# Patient Record
Sex: Male | Born: 1975 | Hispanic: Yes | Marital: Married | Smoking: Never smoker
Health system: Southern US, Community
[De-identification: ages and names within clinical notes are randomized; demographics above are authoritative.]

## PROBLEM LIST (undated history)

## (undated) DIAGNOSIS — I10 Essential (primary) hypertension: Secondary | ICD-10-CM

## (undated) HISTORY — DX: Essential (primary) hypertension: I10

---

## 1999-03-12 ENCOUNTER — Encounter: Payer: Self-pay | Admitting: Emergency Medicine

## 1999-03-12 ENCOUNTER — Emergency Department (HOSPITAL_COMMUNITY): Admission: EM | Admit: 1999-03-12 | Discharge: 1999-03-12 | Payer: Self-pay

## 2013-09-16 ENCOUNTER — Ambulatory Visit: Payer: No Typology Code available for payment source | Attending: Internal Medicine

## 2013-10-03 ENCOUNTER — Ambulatory Visit: Payer: Self-pay | Admitting: Internal Medicine

## 2013-10-10 ENCOUNTER — Ambulatory Visit: Payer: No Typology Code available for payment source | Attending: Internal Medicine | Admitting: Internal Medicine

## 2013-10-10 ENCOUNTER — Encounter: Payer: Self-pay | Admitting: Internal Medicine

## 2013-10-10 ENCOUNTER — Ambulatory Visit (HOSPITAL_COMMUNITY)
Admission: RE | Admit: 2013-10-10 | Discharge: 2013-10-10 | Disposition: A | Discharge status: Home / Self Care | Payer: No Typology Code available for payment source | Source: Ambulatory Visit | Attending: Internal Medicine | Admitting: Internal Medicine

## 2013-10-10 VITALS — BP 146/100 | HR 96 | Temp 98.6°F | Resp 14 | Ht 66.0 in | Wt 205.2 lb

## 2013-10-10 DIAGNOSIS — Z008 Encounter for other general examination: Secondary | ICD-10-CM | POA: Insufficient documentation

## 2013-10-10 DIAGNOSIS — I10 Essential (primary) hypertension: Secondary | ICD-10-CM | POA: Insufficient documentation

## 2013-10-10 DIAGNOSIS — R079 Chest pain, unspecified: Secondary | ICD-10-CM | POA: Insufficient documentation

## 2013-10-10 DIAGNOSIS — Z833 Family history of diabetes mellitus: Secondary | ICD-10-CM | POA: Insufficient documentation

## 2013-10-10 DIAGNOSIS — Z23 Encounter for immunization: Secondary | ICD-10-CM | POA: Insufficient documentation

## 2013-10-10 LAB — CBC WITH DIFFERENTIAL/PLATELET
BASOS ABS: 0 10*3/uL (ref 0.0–0.1)
BASOS PCT: 0 % (ref 0–1)
Eosinophils Absolute: 0 10*3/uL (ref 0.0–0.7)
Eosinophils Relative: 1 % (ref 0–5)
HCT: 47.6 % (ref 39.0–52.0)
Hemoglobin: 17.1 g/dL — ABNORMAL HIGH (ref 13.0–17.0)
Lymphocytes Relative: 40 % (ref 12–46)
Lymphs Abs: 1.7 10*3/uL (ref 0.7–4.0)
MCH: 30.6 pg (ref 26.0–34.0)
MCHC: 35.9 g/dL (ref 30.0–36.0)
MCV: 85.2 fL (ref 78.0–100.0)
Monocytes Absolute: 0.3 10*3/uL (ref 0.1–1.0)
Monocytes Relative: 8 % (ref 3–12)
NEUTROS PCT: 51 % (ref 43–77)
Neutro Abs: 2.1 10*3/uL (ref 1.7–7.7)
PLATELETS: 186 10*3/uL (ref 150–400)
RBC: 5.59 MIL/uL (ref 4.22–5.81)
RDW: 13.6 % (ref 11.5–15.5)
WBC: 4.2 10*3/uL (ref 4.0–10.5)

## 2013-10-10 LAB — COMPLETE METABOLIC PANEL WITH GFR
ALBUMIN: 4.4 g/dL (ref 3.5–5.2)
ALT: 37 U/L (ref 0–53)
AST: 20 U/L (ref 0–37)
Alkaline Phosphatase: 49 U/L (ref 39–117)
BUN: 9 mg/dL (ref 6–23)
CALCIUM: 9.1 mg/dL (ref 8.4–10.5)
CHLORIDE: 101 meq/L (ref 96–112)
CO2: 30 meq/L (ref 19–32)
CREATININE: 0.89 mg/dL (ref 0.50–1.35)
GFR, Est African American: >89 mL/min
GFR, Est Non African American: >89 mL/min
Glucose, Bld: 99 mg/dL (ref 70–99)
Potassium: 4.2 mEq/L (ref 3.5–5.3)
Sodium: 138 mEq/L (ref 135–145)
Total Bilirubin: 0.7 mg/dL (ref 0.2–1.2)
Total Protein: 7.3 g/dL (ref 6.0–8.3)

## 2013-10-10 LAB — LIPID PANEL
Cholesterol: 178 mg/dL (ref 0–200)
HDL: 31 mg/dL — ABNORMAL LOW (ref >39)
Total CHOL/HDL Ratio: 5.7 Ratio
Triglycerides: 420 mg/dL — ABNORMAL HIGH (ref <150)

## 2013-10-10 LAB — TROPONIN I: Troponin I: <0.01 ng/mL (ref <0.06)

## 2013-10-10 LAB — TSH: TSH: 3.067 u[IU]/mL (ref 0.350–4.500)

## 2013-10-10 LAB — D-DIMER, QUANTITATIVE (NOT AT ARMC): D DIMER QUANT: 0.27 ug{FEU}/mL (ref 0.00–0.48)

## 2013-10-10 MED ORDER — LISINOPRIL 10 MG PO TABS: 5.0000 mg | ORAL_TABLET | Freq: Every day | ORAL | Status: DC

## 2013-10-10 NOTE — Progress Notes (Signed)
Patient ID: Edwin Hernandez, male   DOB: 08-17-1976, 38 y.o.   MRN: [id number]   CC:  HPI: 38 year old male with a history of hypertension, here to establish care. The patient states that he's had intermittent chest pain on and off since December. The patient had continuous chest pain for one week that resolved, mostly noted during exertion, pain radiated November stated his chest. Occasional intermittent shortness of breath. Exacerbated by anxiety. Not reproducible by palpation of the chest. No intercurrent illness such as flu or URI like symptoms. He had PCP who started him on lisinopril. The patient did take his lisinopril this morning. Blood pressures in the 140 systolic. No history of previous cardiac testing.  Patient has had early childhood immunizations. He has never received a flu shot. social history Nonsmoker nonalcoholic Family history positive for diabetes in the mother Not on File Past Medical History  Diagnosis Date  . Hypertension    No current outpatient prescriptions on file prior to visit.   No current facility-administered medications on file prior to visit.   Family History  Problem Relation Age of Onset  . Diabetes Mother    History   Social History  . Marital Status: Married    Spouse Name: N/A    Number of Children: N/A  . Years of Education: N/A   Occupational History  . Not on file.   Social History Main Topics  . Smoking status: Never Smoker   . Smokeless tobacco: Not on file  . Alcohol Use: No  . Drug Use: No  . Sexual Activity: Not on file   Other Topics Concern  . Not on file   Social History Narrative  . No narrative on file    Review of Systems  Constitutional: Negative for fever, chills, diaphoresis, activity change, appetite change and fatigue.  HENT: Negative for ear pain, nosebleeds, congestion, facial swelling, rhinorrhea, neck pain, neck stiffness and ear discharge.   Eyes: Negative for pain, discharge, redness, itching and visual  disturbance.  Respiratory: Negative for cough, choking, chest tightness, shortness of breath, wheezing and stridor.   Cardiovascular: As in history of present illness  Gastrointestinal: Negative for abdominal distention.  Genitourinary: Negative for dysuria, urgency, frequency, hematuria, flank pain, decreased urine volume, difficulty urinating and dyspareunia.  Musculoskeletal: Negative for back pain, joint swelling, arthralgias and gait problem.  Neurological: Negative for dizziness, tremors, seizures, syncope, facial asymmetry, speech difficulty, weakness, light-headedness, numbness and headaches.  Hematological: Negative for adenopathy. Does not bruise/bleed easily.  Psychiatric/Behavioral: Negative for hallucinations, behavioral problems, confusion, dysphoric mood, decreased concentration and agitation.    Objective:   Filed Vitals:   10/10/13 0914  BP: 146/100  Pulse: 96  Temp: 98.6 F (37 C)  Resp: 14    Physical Exam  Constitutional: Appears well-developed and well-nourished. No distress.  HENT: Normocephalic. External right and left ear normal. Oropharynx is clear and moist.  Eyes: Conjunctivae and EOM are normal. PERRLA, no scleral icterus.  Neck: Normal ROM. Neck supple. No JVD. No tracheal deviation. No thyromegaly.  CVS: RRR, S1/S2 +, no murmurs, no gallops, no carotid bruit.  Pulmonary: Effort and breath sounds normal, no stridor, rhonchi, wheezes, rales.  Abdominal: Soft. BS +,  no distension, tenderness, rebound or guarding.  Musculoskeletal: Normal range of motion. No edema and no tenderness.  Lymphadenopathy: No lymphadenopathy noted, cervical, inguinal. Neuro: Alert. Normal reflexes, muscle tone coordination. No cranial nerve deficit. Skin: Skin is warm and dry. No rash noted. Not diaphoretic. No erythema. No pallor.  Psychiatric: Normal mood and affect. Behavior, judgment, thought content normal.   No results found for this basename: WBC, HGB, HCT, MCV, PLT    No results found for this basename: CREATININE, BUN, NA, K, CL, CO2    No results found for this basename: HGBA1C   Lipid Panel  No results found for this basename: chol, trig, hdl, cholhdl, vldl, ldlcalc       Assessment and plan:   There are no active problems to display for this patient.  Hypertension Increase lisinopril to 10 mg a day Baseline labs, lipid panel, renal panel   Chest pain Obtain an EKG, 2-D echo Stat d-dimer, troponin If these are abnormal the patient will be scheduled for a stress test    Establish care The patient will be provided with a flu vaccination and tetanus immunization today we'll also obtain baseline labs         The patient was given clear instructions to go to ER or return to medical center if symptoms don't improve, worsen or new problems develop. The patient verbalized understanding. The patient was told to call to get any lab results if not heard anything in the next week.

## 2013-10-10 NOTE — Progress Notes (Signed)
Pt os here to establish care. Complains of chest pain and acid reflex x3 weeks. Has slight nausea/vomiting, fatigue, racing pulse, and has been hypotensive during chest pain. Also complains of headaches x2 weeks.

## 2013-10-11 ENCOUNTER — Telehealth: Payer: Self-pay | Admitting: *Deleted

## 2013-10-11 LAB — VITAMIN D 25 HYDROXY (VIT D DEFICIENCY, FRACTURES): VIT D 25 HYDROXY: 29 ng/mL — AB (ref 30–89)

## 2013-10-11 MED ORDER — GEMFIBROZIL 600 MG PO TABS: 600.0000 mg | ORAL_TABLET | Freq: Two times a day (BID) | ORAL | Status: DC

## 2013-10-11 NOTE — Telephone Encounter (Signed)
Message copied by Jameeka Marcy, [place] on Tue Oct 11, 2013  2:15 PM ------      Message from: Susie CassetteABROL MD, [facility]      Created: Mon Oct 10, 2013  2:11 PM       Chest x-ray negative ------

## 2013-10-11 NOTE — Telephone Encounter (Signed)
Message copied by Sheba Whaling, [place] on Tue Oct 11, 2013  5:33 PM ------      Message from: Susie CassetteABROL MD, [facility]      Created: Tue Oct 11, 2013  4:28 PM       Notify patient of the labs are normal with the exception of vitamin D which is 729, the patient can start taking vitamin D 2000 international units over-the-counter twice a day            Triglycerides of 420. His call in a prescription for Lopid 600 mg twice a day, ------

## 2013-10-11 NOTE — Telephone Encounter (Signed)
Tried contacting patient. Telephone line was busy. [facility]

## 2013-10-11 NOTE — Telephone Encounter (Signed)
Contacted patient with an interpreter. Unable to reach patient and also unable to leave a voicemail due to mailbox not being set up yet.

## 2013-10-12 ENCOUNTER — Other Ambulatory Visit: Payer: Self-pay | Admitting: *Deleted

## 2013-10-12 MED ORDER — GEMFIBROZIL 600 MG PO TABS: 600.0000 mg | ORAL_TABLET | Freq: Two times a day (BID) | ORAL | Status: DC

## 2013-10-18 ENCOUNTER — Ambulatory Visit (HOSPITAL_COMMUNITY): Payer: No Typology Code available for payment source

## 2013-11-01 ENCOUNTER — Ambulatory Visit: Payer: No Typology Code available for payment source | Attending: Internal Medicine | Admitting: *Deleted

## 2013-11-01 VITALS — BP 126/85 | HR 92

## 2013-11-01 DIAGNOSIS — I10 Essential (primary) hypertension: Secondary | ICD-10-CM

## 2013-11-01 MED ORDER — GEMFIBROZIL 600 MG PO TABS: 600.0000 mg | ORAL_TABLET | Freq: Two times a day (BID) | ORAL | Status: DC

## 2013-11-02 ENCOUNTER — Ambulatory Visit (HOSPITAL_COMMUNITY)
Admission: RE | Admit: 2013-11-02 | Discharge: 2013-11-02 | Disposition: A | Discharge status: Home / Self Care | Payer: No Typology Code available for payment source | Source: Ambulatory Visit | Attending: Internal Medicine | Admitting: Internal Medicine

## 2013-11-02 DIAGNOSIS — I519 Heart disease, unspecified: Secondary | ICD-10-CM

## 2013-11-02 DIAGNOSIS — R079 Chest pain, unspecified: Secondary | ICD-10-CM

## 2013-11-02 DIAGNOSIS — I10 Essential (primary) hypertension: Secondary | ICD-10-CM

## 2013-11-02 NOTE — Progress Notes (Signed)
  Echocardiogram 2D Echocardiogram has been performed.  Edwin Hernandez, [name] 11/02/2013, 10:35 AM

## 2013-11-04 ENCOUNTER — Telehealth: Payer: Self-pay | Admitting: Internal Medicine

## 2013-11-04 NOTE — Telephone Encounter (Signed)
Pt has come in today to check the status of his lab results; please give pt a call @ [phone]

## 2013-11-07 ENCOUNTER — Telehealth: Payer: Self-pay | Admitting: Emergency Medicine

## 2013-11-07 NOTE — Telephone Encounter (Signed)
Left message for pt to call clinic

## 2013-12-08 ENCOUNTER — Ambulatory Visit: Payer: No Typology Code available for payment source

## 2014-02-14 ENCOUNTER — Ambulatory Visit: Payer: No Typology Code available for payment source | Admitting: Internal Medicine

## 2014-05-12 ENCOUNTER — Ambulatory Visit: Payer: Self-pay | Attending: Family Medicine

## 2014-06-26 ENCOUNTER — Ambulatory Visit: Payer: Self-pay | Attending: Internal Medicine | Admitting: Internal Medicine

## 2014-06-26 ENCOUNTER — Encounter: Payer: Self-pay | Admitting: Internal Medicine

## 2014-06-26 VITALS — BP 144/87 | HR 79 | Temp 98.0°F | Resp 16 | Wt 205.0 lb

## 2014-06-26 DIAGNOSIS — R1012 Left upper quadrant pain: Secondary | ICD-10-CM | POA: Insufficient documentation

## 2014-06-26 DIAGNOSIS — Z139 Encounter for screening, unspecified: Secondary | ICD-10-CM

## 2014-06-26 DIAGNOSIS — R1013 Epigastric pain: Secondary | ICD-10-CM | POA: Insufficient documentation

## 2014-06-26 DIAGNOSIS — I10 Essential (primary) hypertension: Secondary | ICD-10-CM | POA: Insufficient documentation

## 2014-06-26 DIAGNOSIS — Z833 Family history of diabetes mellitus: Secondary | ICD-10-CM | POA: Insufficient documentation

## 2014-06-26 LAB — CBC WITH DIFFERENTIAL/PLATELET
Basophils Absolute: 0 10*3/uL (ref 0.0–0.1)
Basophils Relative: 0 % (ref 0–1)
Eosinophils Absolute: 0.1 10*3/uL (ref 0.0–0.7)
Eosinophils Relative: 1 % (ref 0–5)
HCT: 46.3 % (ref 39.0–52.0)
Hemoglobin: 16.2 g/dL (ref 13.0–17.0)
Lymphocytes Relative: 43 % (ref 12–46)
Lymphs Abs: 2.2 10*3/uL (ref 0.7–4.0)
MCH: 30.1 pg (ref 26.0–34.0)
MCHC: 35 g/dL (ref 30.0–36.0)
MCV: 86.1 fL (ref 78.0–100.0)
Monocytes Absolute: 0.5 10*3/uL (ref 0.1–1.0)
Monocytes Relative: 9 % (ref 3–12)
Neutro Abs: 2.4 10*3/uL (ref 1.7–7.7)
Neutrophils Relative %: 47 % (ref 43–77)
Platelets: 189 10*3/uL (ref 150–400)
RBC: 5.38 MIL/uL (ref 4.22–5.81)
RDW: 13.9 % (ref 11.5–15.5)
WBC: 5.2 10*3/uL (ref 4.0–10.5)

## 2014-06-26 MED ORDER — OMEPRAZOLE 20 MG PO CPDR: 20.0000 mg | DELAYED_RELEASE_CAPSULE | Freq: Every day | ORAL | Status: DC

## 2014-06-26 NOTE — Progress Notes (Signed)
Patient here with interpreter Patient is here to establish care Currently takes no medications Complains of lower left side abd pain that has been there for a couple of months

## 2014-06-26 NOTE — Progress Notes (Signed)
Patient Demographics  Edwin SpenceJose Hernandez, is a 38 y.o. male  WUJ:[name]  [id number]  DOB - 12/27/1973  CC:  Chief Complaint  Patient presents with  . Establish Care       HPI: Edwin SpenceJose Hernandez is a 38 y.o. male here today to establish medical care. Patient has history of hypertension  , as per patient he used to take her blood pressure medication 3 years ago then he stopped and dry to modify the diet today's blood pressure is 144/87, denies any headache dizziness chest and shortness of breath does complain of upper abdominal pain which is on the left side and bloating sensation after eating occasionally change in his bowel habits , denies nausea vomiting  Or any urinary symptoms. Patient has No headache, No chest pain, No abdominal pain - No Nausea, No new weakness tingling or numbness, No Cough - SOB.  No Known Allergies History reviewed. No pertinent past medical history. No current outpatient prescriptions on file prior to visit.   No current facility-administered medications on file prior to visit.   Family History  Problem Relation Age of Onset  . Diabetes Mother   . Stroke Maternal Uncle    History   Social History  . Marital Status: Married    Spouse Name: N/A    Number of Children: N/A  . Years of Education: N/A   Occupational History  . Not on file.   Social History Main Topics  . Smoking status: Never Smoker   . Smokeless tobacco: Not on file  . Alcohol Use: No  . Drug Use: Not on file  . Sexual Activity: Not on file   Other Topics Concern  . Not on file   Social History Narrative  . No narrative on file    Review of Systems: Constitutional: Negative for fever, chills, diaphoresis, activity change, appetite change and fatigue. HENT: Negative for ear pain, nosebleeds, congestion, facial swelling, rhinorrhea, neck pain, neck stiffness and ear discharge.  Eyes: Negative for pain, discharge, redness, itching and visual disturbance. Respiratory: Negative  for cough, choking, chest tightness, shortness of breath, wheezing and stridor.  Cardiovascular: Negative for chest pain, palpitations and leg swelling. Gastrointestinal: Negative for abdominal distention. Genitourinary: Negative for dysuria, urgency, frequency, hematuria, flank pain, decreased urine volume, difficulty urinating and dyspareunia.  Musculoskeletal: Negative for back pain, joint swelling, arthralgia and gait problem. Neurological: Negative for dizziness, tremors, seizures, syncope, facial asymmetry, speech difficulty, weakness, light-headedness, numbness and headaches.  Hematological: Negative for adenopathy. Does not bruise/bleed easily. Psychiatric/Behavioral: Negative for hallucinations, behavioral problems, confusion, dysphoric mood, decreased concentration and agitation.    Objective:   Filed Vitals:   06/26/14 1408  BP: 144/87  Pulse: 79  Temp: 98 F (36.7 C)  Resp: 16    Physical Exam: Constitutional: Patient appears well-developed and well-nourished. No distress. HENT: Normocephalic, atraumatic, External right and left ear normal. Oropharynx is clear and moist.  Eyes: Conjunctivae and EOM are normal. PERRLA, no scleral icterus. Neck: Normal ROM. Neck supple. No JVD. No tracheal deviation. No thyromegaly. CVS: RRR, S1/S2 +, no murmurs, no gallops, no carotid bruit.  Pulmonary: Effort and breath sounds normal, no stridor, rhonchi, wheezes, rales.  Abdominal: Soft. BS +, no distension, tenderness, rebound or guarding.  Musculoskeletal: Normal range of motion. No edema and no tenderness.  Neuro: Alert. Normal reflexes, muscle tone coordination. No cranial nerve deficit. Skin: Skin is warm and dry. No rash noted. Not diaphoretic. No erythema. No pallor. Psychiatric: Normal mood and affect.  Behavior, judgment, thought content normal.  No results found for this basename: WBC, HGB, HCT, MCV, PLT   No results found for this basename: CREATININE, BUN, NA, K, CL, CO2     No results found for this basename: HGBA1C   Lipid Panel  No results found for this basename: chol, trig, hdl, cholhdl, vldl, ldlcalc       Assessment and plan:   1. Essential hypertension currently patient is trying to modify diet her, and out for DASH diet. If on the following visit blood pressure is not improving consider starting her medication.  - COMPLETE METABOLIC PANEL WITH GFR  2. Family history of diabetes mellitus (DM)  - Hemoglobin A1c  3. Screening ordered baseline blood work  - CBC with Differential - Vit D  25 hydroxy (rtn osteoporosis monitoring) - TSH - Lipid panel  4. Dyspepsia  - US Abdomen Complete; Future - trial of  omeprazole (PRILOSEC) 20 MG capsule; Take 1 capsule (20 mg total) by mouth daily.  Dispense: 30 capsule; Refill: 3  5. Abdominal pain, left upper quadrant  - US Abdomen Complete; Future      Health Maintenance Patient declines flu shot  Return in about 3 months (around 09/26/2014) for hypertension.   Doris CheadleADVANI, Dalina Samara, MD

## 2014-06-26 NOTE — Patient Instructions (Addendum)
Plan de alimentacin DASH (DASH Eating Plan) DASH es la sigla en ingls de "Enfoques Alimentarios para Detener la Hipertensin". El plan de alimentacin DASH ha demostrado bajar la presin arterial elevada (hipertensin). Los beneficios adicionales para la salud pueden incluir la disminucin del riesgo de diabetes mellitus tipo2, enfermedades cardacas e ictus. Este plan tambin puede ayudar a adelgazar. QU DEBO SABER ACERCA DEL PLAN DE ALIMENTACIN DASH? Para el plan de alimentacin DASH, seguir las siguientes pautas generales:  Elija los alimentos con un valor porcentual diario de sodio de menos del 5% (segn figura en la etiqueta del alimento).  Use hierbas o aderezos sin sal, en lugar de sal de mesa o sal marina.  Consulte al mdico o farmacutico antes de usar sustitutos de la sal.  Coma productos con bajo contenido de sodio, cuya etiqueta suele decir "bajo contenido de sodio" o "sin agregado de sal".  Coma alimentos frescos.  Coma ms verduras, frutas y productos lcteos con bajo contenido de grasas.  Elija los cereales integrales. Busque la palabra "integral" en el primer lugar de la lista de ingredientes.  Elija el pescado y el pollo o el pavo sin piel ms a menudo que las carnes rojas. Limite el consumo de pescado, carne de ave y carne a 6onzas (170g) por da.  Limite el consumo de dulces, postres, azcares y bebidas azucaradas.  Elija las grasas saludables para el corazn.  Limite el consumo de queso a 1onza (28g) por da.  Consuma ms comida casera y menos de restaurante, de buf y comida rpida.  Limite el consumo de alimentos fritos.  Cocine los alimentos utilizando mtodos que no sean la fritura.  Limite las verduras enlatadas. Si las consume, enjuguelas bien para disminuir el sodio.  Cuando coma en un restaurante, pida que preparen su comida con menos sal o, en lo posible, sin nada de sal. QU ALIMENTOS PUEDO COMER? Pida ayuda a un nutricionista para  conocer las necesidades calricas individuales. Cereales Pan de salvado o integral. Arroz integral. Pastas de salvado o integrales. Quinua, trigo burgol y cereales integrales. Cereales con bajo contenido de sodio. Tortillas de harina de maz o de salvado. Pan de maz integral. Galletas saladas integrales. Galletas con bajo contenido de sodio. Vegetales Verduras frescas o congeladas (crudas, al vapor, asadas o grilladas). Jugos de tomate y verduras con contenido bajo o reducido de sodio. Pasta y salsa de tomate con contenido bajo o reducido de sodio. Verduras enlatadas con bajo contenido de sodio o reducido de sodio.  Frutas Frutas frescas, en conserva (en su jugo natural) o frutas congeladas. Carnes y otros productos con protenas Carne de res molida (al 85% o ms magra), carne de res de animales alimentados con pastos o carne de res sin la grasa. Pollo o pavo sin piel. Carne de pollo o de pavo molida. Cerdo sin la grasa. Todos los pescados y frutos de mar. Huevos. Porotos, guisantes o lentejas secos. Frutos secos y semillas sin sal. Frijoles enlatados sin sal. Lcteos Productos lcteos con bajo contenido de grasas, como leche descremada o al 1%, quesos reducidos en grasas o al 2%, ricota con bajo contenido de grasas o queso cottage, o yogur natural con bajo contenido de grasas. Quesos con contenido bajo o reducido de sodio. Grasas y aceites Margarinas en barra que no contengan grasas trans. Mayonesa y alios para ensaladas livianos o reducidos en grasas (reducidos en sodio). [place]. Aceites de crtamo, oliva o canola. Mantequilla natural de man o almendra. Otros Palomitas de maz y pretzels sin sal.   Los artculos mencionados arriba pueden no ser una lista completa de las bebidas o los alimentos recomendados. Comunquese con el nutricionista para conocer ms opciones. QU ALIMENTOS NO SE RECOMIENDAN? Cereales Pan blanco. Pastas blancas. Arroz blanco. Pan de maz refinado. Bagels y  croissants. Galletas saladas que contengan grasas trans. Vegetales Vegetales con crema o fritos. Verduras en salsa de queso. Verduras enlatadas comunes. Pasta y salsa de tomate en lata comunes. Jugos comunes de tomate y de verduras. Frutas Frutas secas. Fruta enlatada en almbar liviano o espeso. Jugo de frutas. Carnes y otros productos con protenas Cortes de carne con grasa. Costillas, alas de pollo, tocineta, salchicha, mortadela, salame, chinchulines, tocino, perros calientes, salchichas alemanas y embutidos envasados. Frutos secos y semillas con sal. Frijoles con sal en lata. Lcteos Leche entera o al 2%, crema, mezcla de leche y crema, y queso crema. Yogur entero o endulzado. Quesos o queso azul con alto contenido de grasas. Cremas no lcteas y coberturas batidas. Quesos procesados, quesos para untar o cuajadas. Condimentos Sal de cebolla y ajo, sal condimentada, sal de mesa y sal marina. Salsas en lata y envasadas. Salsa Worcestershire. Salsa trtara. Salsa barbacoa. Salsa teriyaki. Salsa de soja, incluso la que tiene contenido reducido de sodio. Salsa de carne. Salsa de pescado. Salsa de ostras. Salsa rosada. Rbano picante. Ketchup y mostaza. Saborizantes y tiernizantes para carne. Caldo en cubitos. Salsa picante. Salsa tabasco. Adobos. Aderezos para tacos. Salsas. Grasas y aceites Mantequilla, margarina en barra, manteca de cerdo, grasa, mantequilla clarificada y grasa de tocino. Aceites de coco, de palmiste o de palma. Aderezos comunes para ensalada. Otros Pickles y aceitunas. Palomitas de maz y pretzels con sal. Los artculos mencionados arriba pueden no ser una lista completa de las bebidas y los alimentos que se deben evitar. Comunquese con el nutricionista para obtener ms informacin. DNDE PUEDO ENCONTRAR MS INFORMACIN? [employer], [employer]mn y [employer] la [facility]re (National Heart, Lung, and Blood Institute):  www.nhlbi.nih.gov/health/health-topics/topics/dash/ Document Released: 08/07/2011 Document Revised: 01/02/2014 Exit[employer] Patient Information 2015 [employer]. This information is not intended to replace advice given to you by your health care provider. Make sure you discuss any questions you have with your health care provider.   

## 2014-06-27 ENCOUNTER — Telehealth: Payer: Self-pay | Admitting: *Deleted

## 2014-06-27 LAB — COMPLETE METABOLIC PANEL WITH GFR
ALT: 35 U/L (ref 0–53)
AST: 19 U/L (ref 0–37)
Albumin: 4.6 g/dL (ref 3.5–5.2)
Alkaline Phosphatase: 55 U/L (ref 39–117)
BUN: 11 mg/dL (ref 6–23)
CO2: 26 mEq/L (ref 19–32)
Calcium: 9.5 mg/dL (ref 8.4–10.5)
Chloride: 100 mEq/L (ref 96–112)
Creat: 0.85 mg/dL (ref 0.50–1.35)
GFR, Est African American: >89 mL/min
GFR, Est Non African American: >89 mL/min
Glucose, Bld: 96 mg/dL (ref 70–99)
Potassium: 4.5 mEq/L (ref 3.5–5.3)
Sodium: 140 mEq/L (ref 135–145)
Total Bilirubin: 0.5 mg/dL (ref 0.2–1.2)
Total Protein: 7.5 g/dL (ref 6.0–8.3)

## 2014-06-27 LAB — LIPID PANEL
Cholesterol: 190 mg/dL (ref 0–200)
HDL: 34 mg/dL — ABNORMAL LOW (ref >39)
LDL Cholesterol: 81 mg/dL (ref 0–99)
Total CHOL/HDL Ratio: 5.6 Ratio
Triglycerides: 376 mg/dL — ABNORMAL HIGH (ref <150)
VLDL: 75 mg/dL — ABNORMAL HIGH (ref 0–40)

## 2014-06-27 LAB — VITAMIN D 25 HYDROXY (VIT D DEFICIENCY, FRACTURES): Vit D, 25-Hydroxy: 30 ng/mL (ref 30–89)

## 2014-06-27 LAB — HEMOGLOBIN A1C
Hgb A1c MFr Bld: 5.9 % — ABNORMAL HIGH (ref <5.7)
Mean Plasma Glucose: 123 mg/dL — ABNORMAL HIGH (ref <117)

## 2014-06-27 LAB — TSH: TSH: 2.856 u[IU]/mL (ref 0.350–4.500)

## 2014-06-27 NOTE — Telephone Encounter (Deleted)
Message copied by Dyann KiefGIRALDEZ, Darcell Yacoub M on Tue Jun 27, 2014  4:07 PM ------      Message from: Doris CheadleADVANI, DEEPAK      Created: Tue Jun 27, 2014  1:04 PM       Blood work reviewed noticed hemoglobin A1c of 5.9%, patient has prediabetes, also noticed elevated triglycerides call and advise patient for low carbohydrate and low-fat diet.             ------

## 2014-06-27 NOTE — Telephone Encounter (Signed)
Message copied by Dyann KiefGIRALDEZ, Avyanna Spada M on Tue Jun 27, 2014  4:15 PM ------      Message from: Doris CheadleADVANI, DEEPAK      Created: Tue Jun 27, 2014  1:04 PM       Blood work reviewed noticed hemoglobin A1c of 5.9%, patient has prediabetes, also noticed elevated triglycerides call and advise patient for low carbohydrate and low-fat diet.             ------

## 2014-06-27 NOTE — Telephone Encounter (Signed)
Pt aware of lab results 

## 2014-06-30 ENCOUNTER — Ambulatory Visit (HOSPITAL_COMMUNITY)
Admission: RE | Admit: 2014-06-30 | Discharge: 2014-06-30 | Disposition: A | Discharge status: Home / Self Care | Payer: Self-pay | Source: Ambulatory Visit | Attending: Internal Medicine | Admitting: Internal Medicine

## 2014-06-30 DIAGNOSIS — R1012 Left upper quadrant pain: Secondary | ICD-10-CM

## 2014-06-30 DIAGNOSIS — K760 Fatty (change of) liver, not elsewhere classified: Secondary | ICD-10-CM | POA: Insufficient documentation

## 2014-06-30 DIAGNOSIS — R1013 Epigastric pain: Secondary | ICD-10-CM

## 2014-07-03 ENCOUNTER — Telehealth: Payer: Self-pay | Admitting: *Deleted

## 2014-07-03 NOTE — Telephone Encounter (Signed)
Pt aware of US results. 

## 2014-07-03 NOTE — Telephone Encounter (Signed)
-----   Message from Doris Cheadleeepak Advani, MD sent at 06/30/2014 12:32 PM EDT ----- Call and let the patient know that his abdominal ultrasound reported possible fatty liver, there are no gallstones.

## 2015-03-13 IMAGING — CR DG CHEST 2V
2 series · 2 of 2 positions shown · non-contrast
Comparison: None.

CLINICAL DATA: Chest pain

EXAM:
CHEST  2 VIEW

[w chest pa]
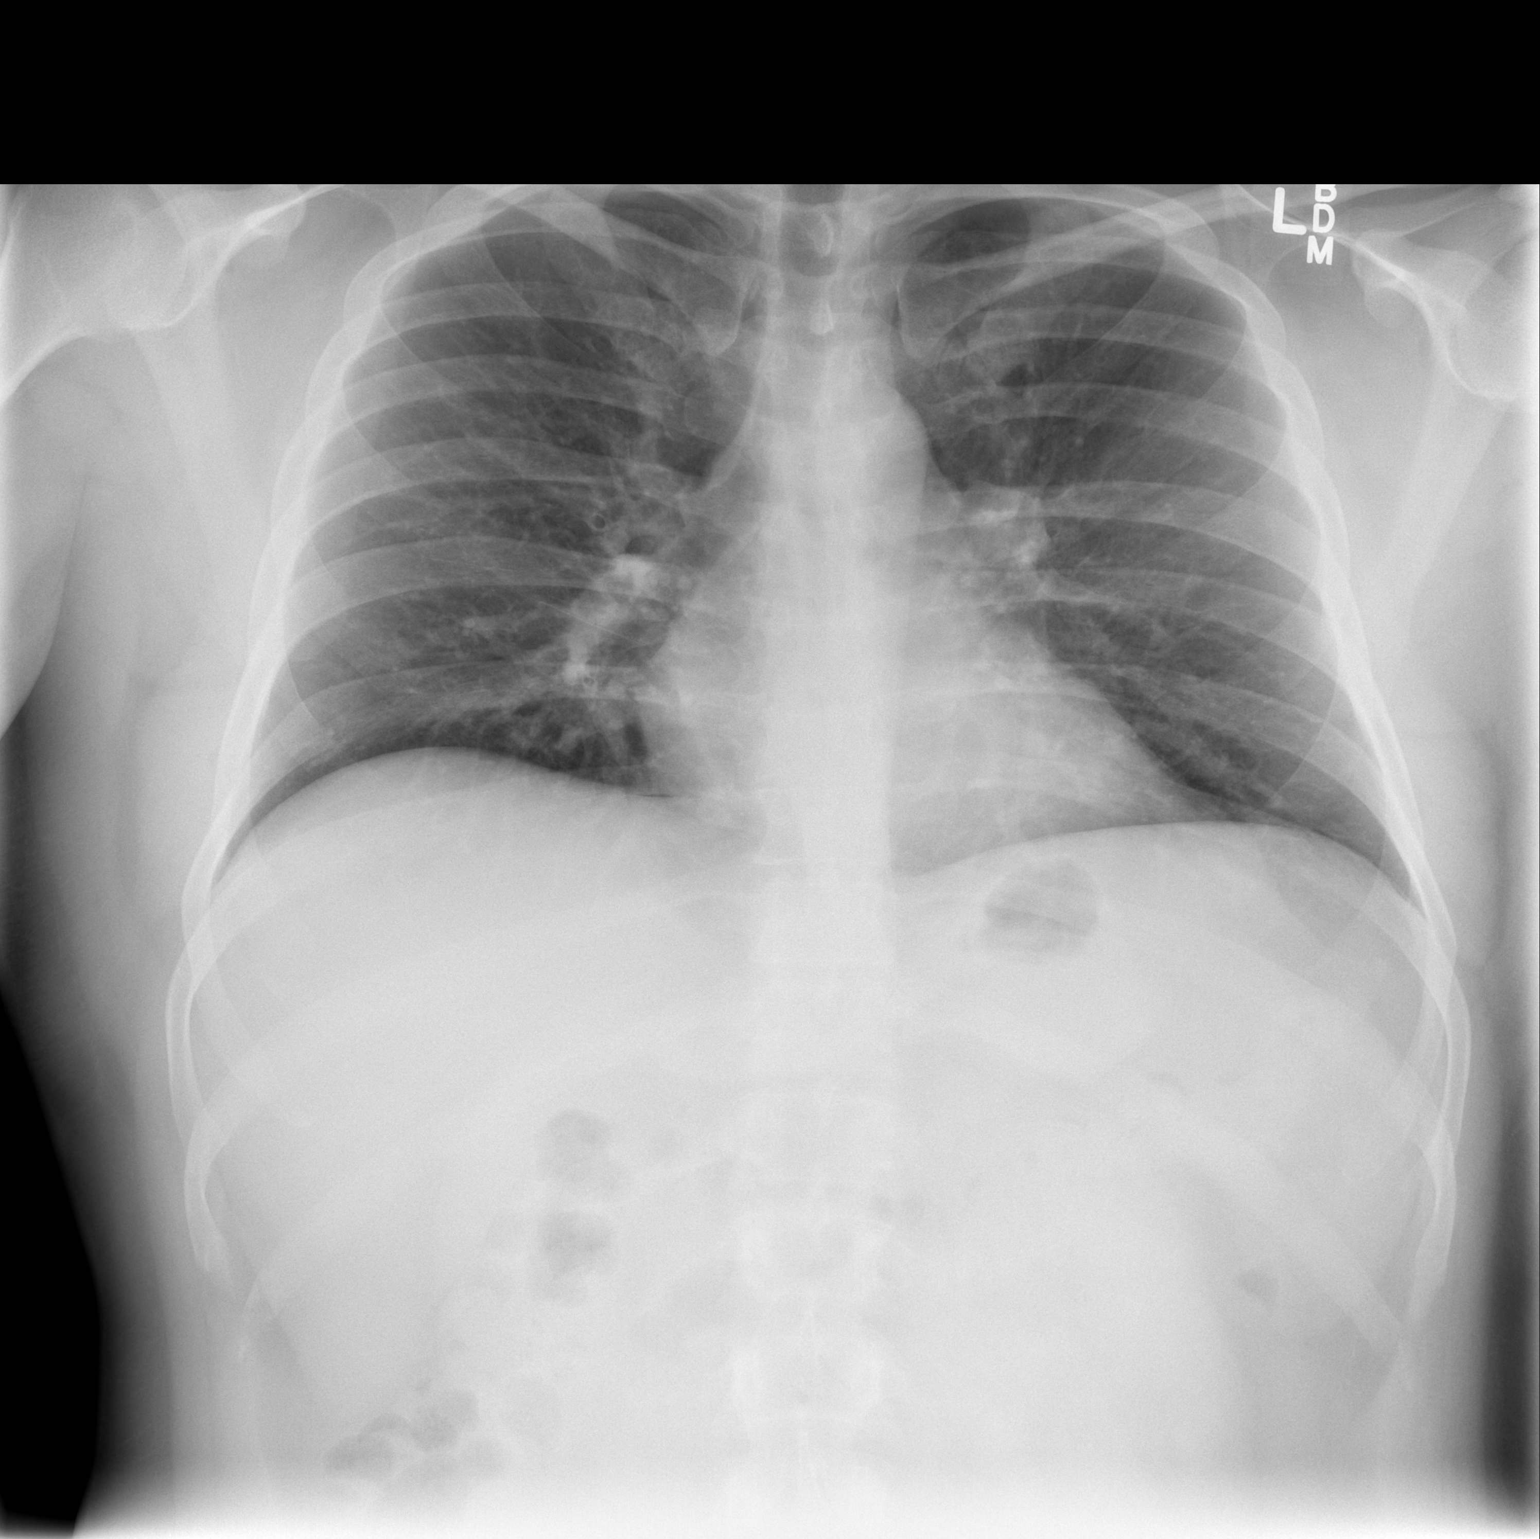

[w chest lat]
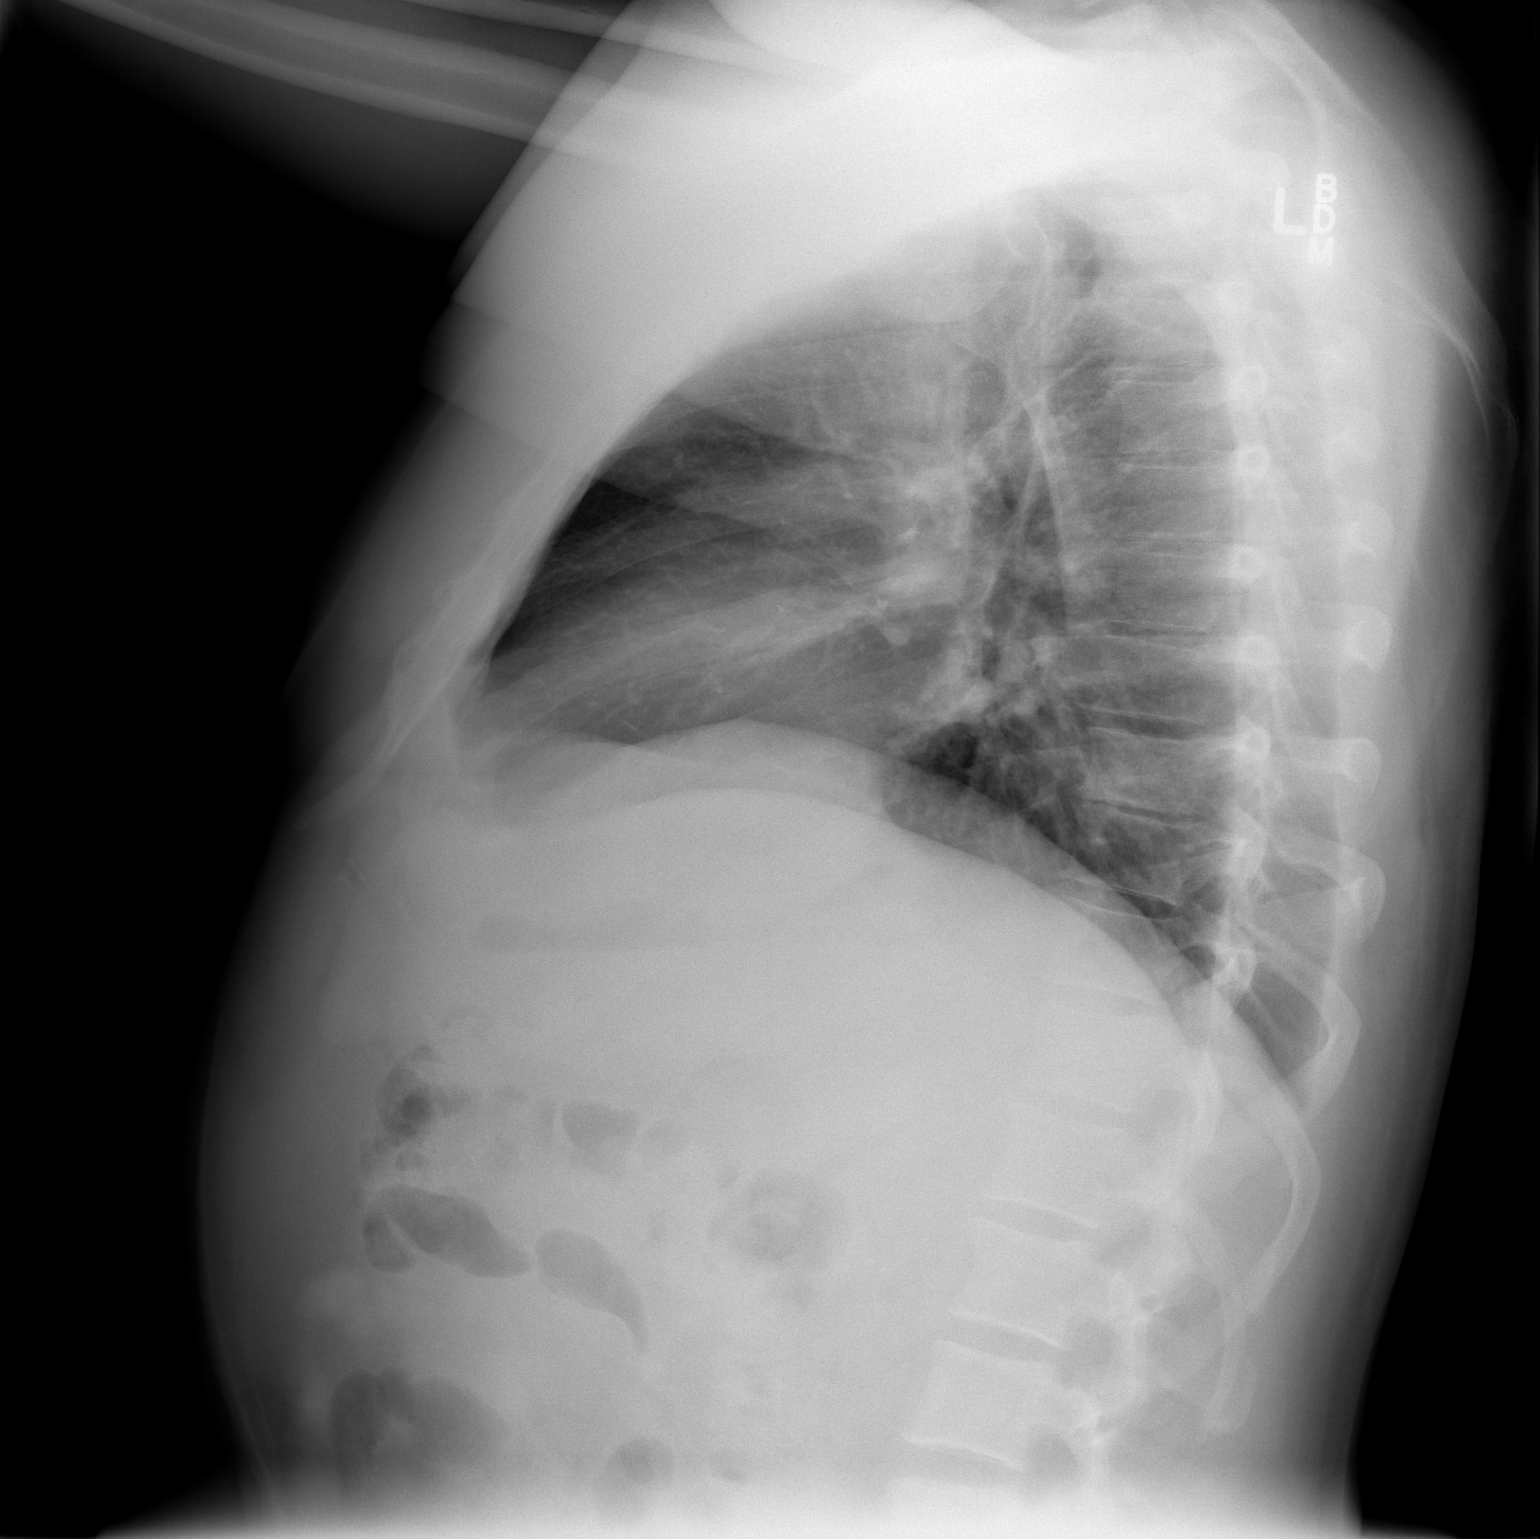

[2 of 2 positions shown; findings below may reference images not displayed]

FINDINGS: The heart size and mediastinal contours are within normal limits.
Both lungs are clear. The visualized skeletal structures are
unremarkable.
IMPRESSION: No active cardiopulmonary disease.

## 2016-02-10 IMAGING — US US ABDOMEN COMPLETE
1 series · 14 of 25 positions shown · non-contrast
Comparison: None.

CLINICAL DATA: Dyspepsia and left upper quadrant abdominal pain.

EXAM:
ULTRASOUND ABDOMEN COMPLETE

[Series 1: us abdomen complete · 0.27mm/px · 14 of 65 slices shown]
[im 1/65]
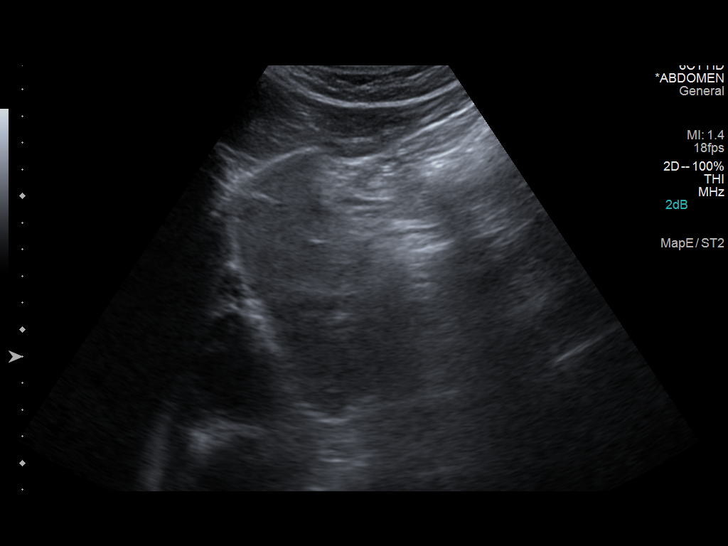
[im 6/65]
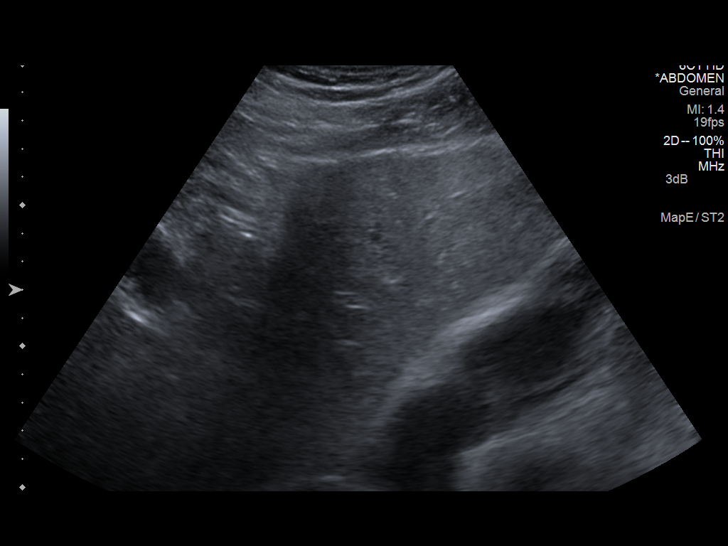
[im 11/65]
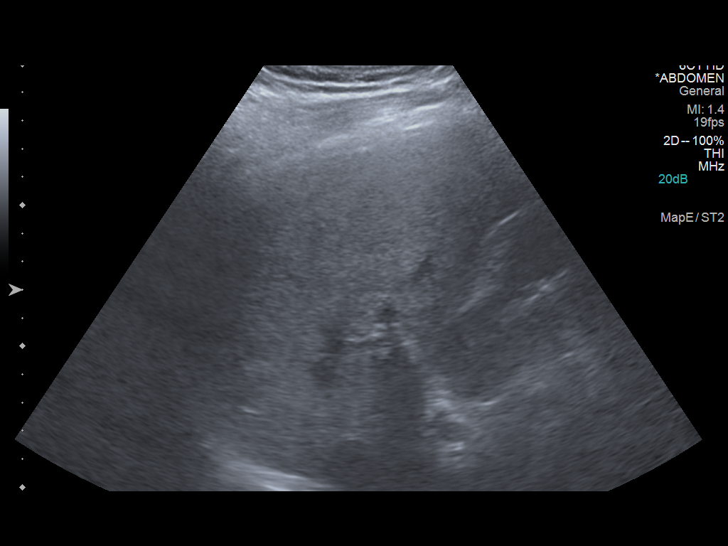
[im 17/65]
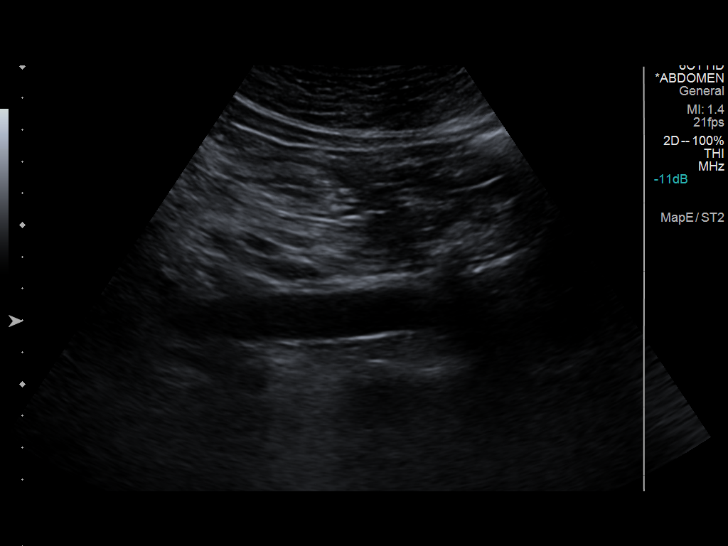
[im 22/65]
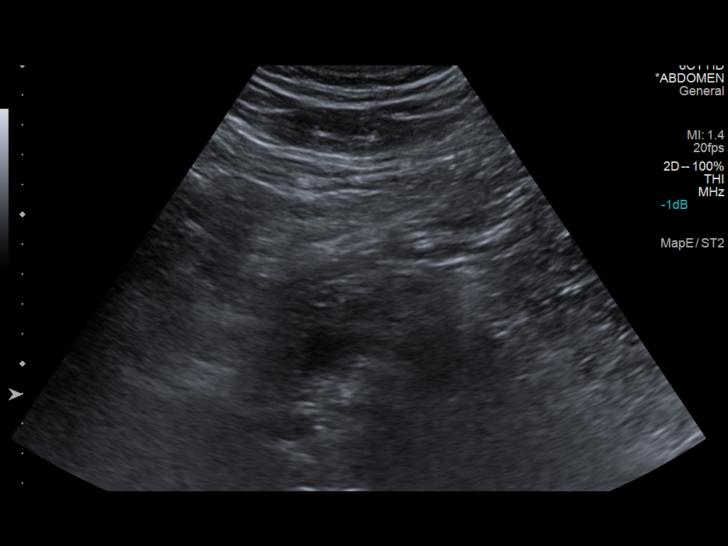
[im 25/65]
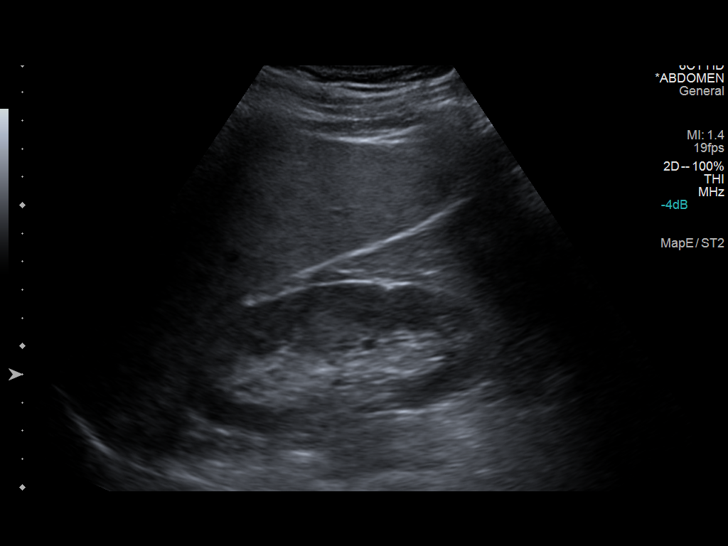
[im 30/65]
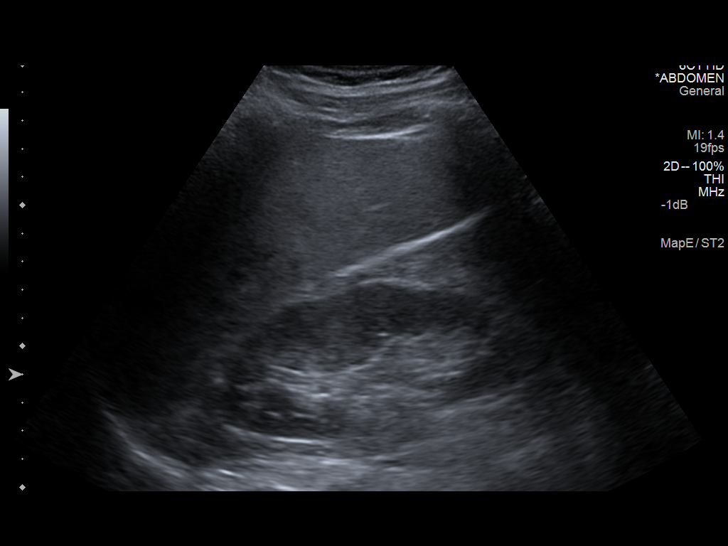
[im 35/65]
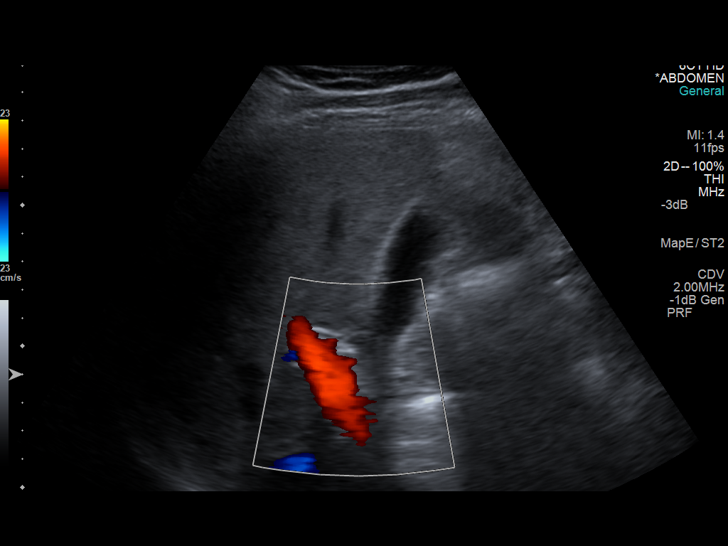
[im 41/65]
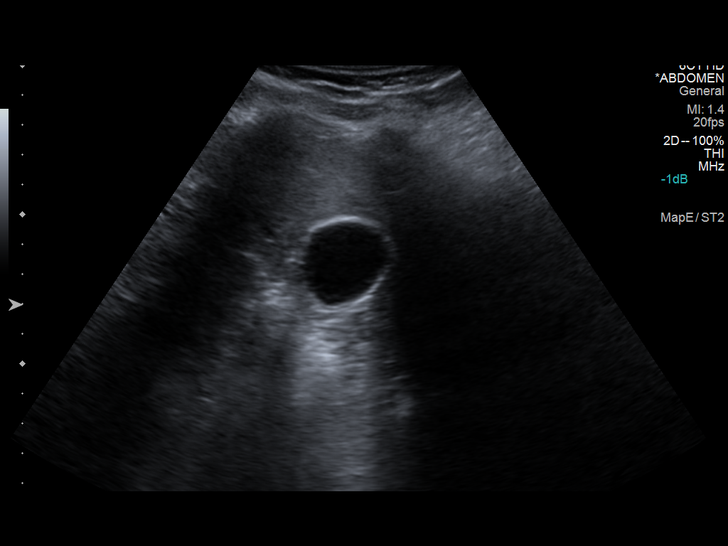
[im 43/65]
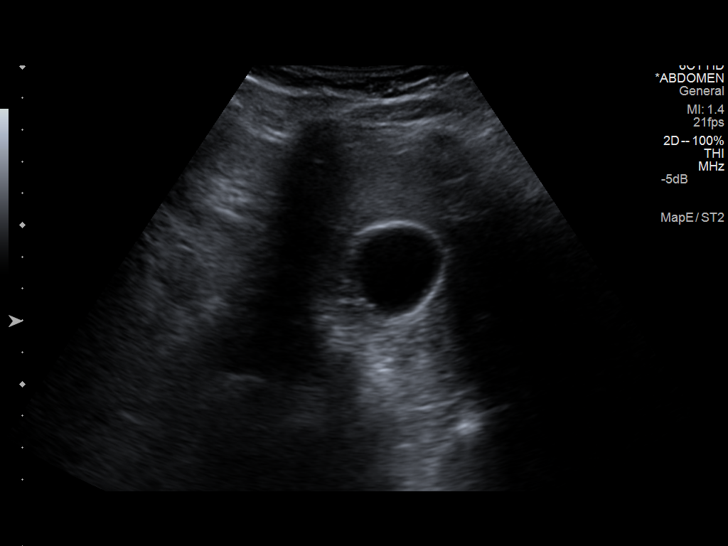
[im 49/65]
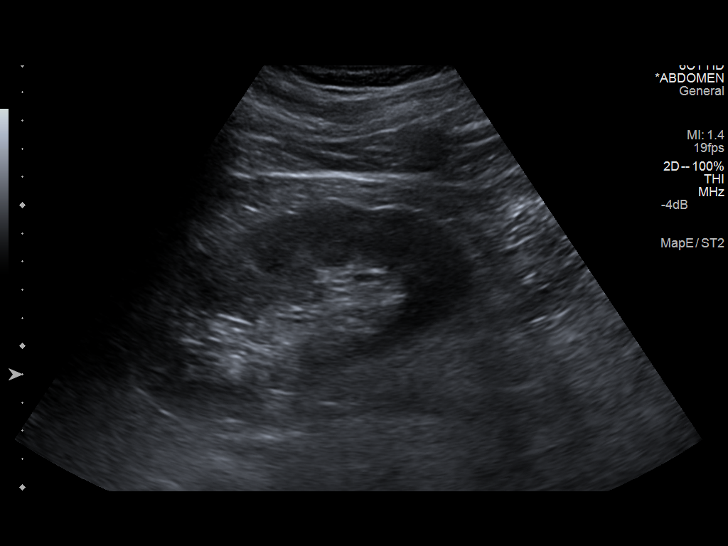
[im 54/65]
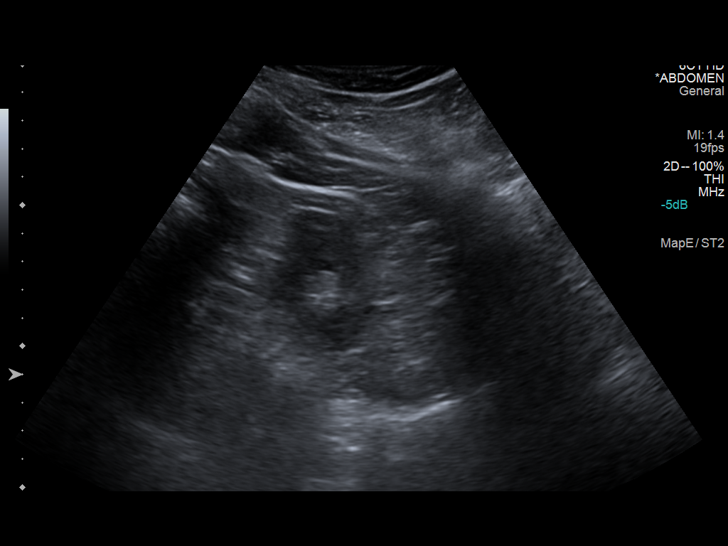
[im 59/65]
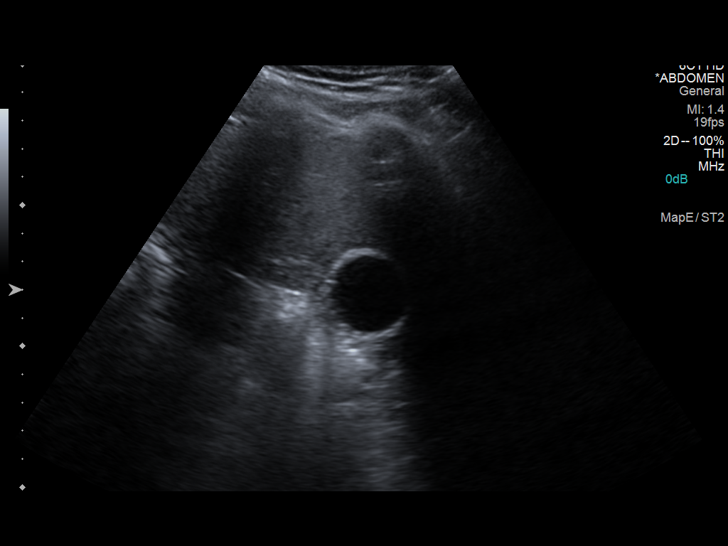
[im 65/65]
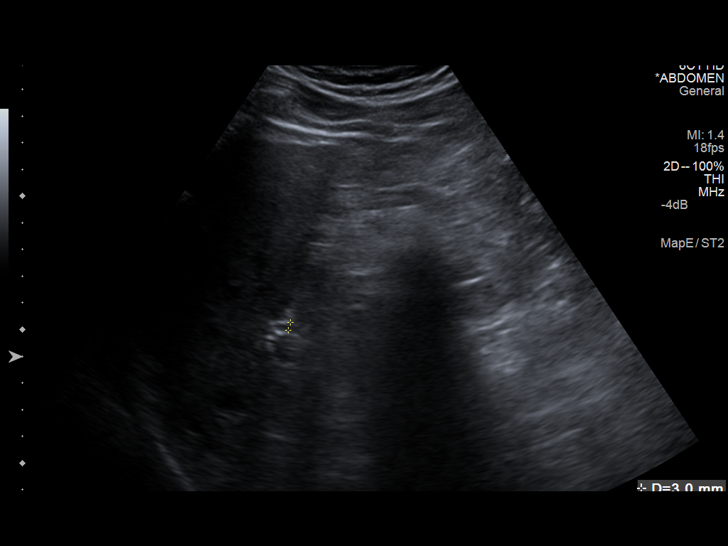

[14 of 25 positions shown; findings below may reference images not displayed]

FINDINGS: Gallbladder: No gallstones or wall thickening visualized. No
sonographic Murphy sign noted.

Common bile duct: Diameter: 3 mm

Liver: Diffusely echogenic and slightly heterogeneous without focal
abnormality identified.

IVC: No abnormality visualized.

Pancreas: Visualized portion unremarkable.

Spleen: Size and appearance within normal limits.

Right Kidney: Length: 12.5 cm. Echogenicity within normal limits. No
mass or hydronephrosis visualized.

Left Kidney: Length: 13.0 cm. Echogenicity within normal limits. No
mass or hydronephrosis visualized.

Abdominal aorta: No aneurysm visualized.

Other findings: None.
IMPRESSION: 1. Diffusely echogenic liver, nonspecific but compatible with
steatosis.
2. Unremarkable appearance of the gallbladder without evidence of
gallstones or biliary dilatation.
3. Normal appearance of the kidneys.

## 2016-04-29 ENCOUNTER — Encounter: Payer: Self-pay | Admitting: Internal Medicine

## 2020-03-16 ENCOUNTER — Other Ambulatory Visit: Payer: Self-pay

## 2020-03-16 ENCOUNTER — Ambulatory Visit
Admission: EM | Admit: 2020-03-16 | Discharge: 2020-03-16 | Disposition: A | Discharge status: Home / Self Care | Payer: Self-pay | Attending: Emergency Medicine | Admitting: Emergency Medicine

## 2020-03-16 DIAGNOSIS — T628X1A Toxic effect of other specified noxious substances eaten as food, accidental (unintentional), initial encounter: Secondary | ICD-10-CM

## 2020-03-16 DIAGNOSIS — T781XXA Other adverse food reactions, not elsewhere classified, initial encounter: Secondary | ICD-10-CM

## 2020-03-16 DIAGNOSIS — R220 Localized swelling, mass and lump, head: Secondary | ICD-10-CM

## 2020-03-16 MED ORDER — PREDNISONE 10 MG (21) PO TBPK: ORAL_TABLET | Freq: Every day | ORAL | 0 refills | Status: DC

## 2020-03-16 MED ORDER — DEXAMETHASONE SODIUM PHOSPHATE 10 MG/ML IJ SOLN
10.0000 mg | Freq: Once | INTRAMUSCULAR | Status: AC
  Administered 2020-03-16: 10 mg via INTRAMUSCULAR

## 2020-03-16 MED ORDER — FAMOTIDINE 40 MG PO TABS
40.0000 mg | ORAL_TABLET | Freq: Once | ORAL | Status: AC
  Administered 2020-03-16: 40 mg via ORAL

## 2020-03-16 MED ORDER — DIPHENHYDRAMINE HCL 50 MG/ML IJ SOLN
50.0000 mg | Freq: Once | INTRAMUSCULAR | Status: AC
  Administered 2020-03-16: 50 mg via INTRAMUSCULAR

## 2020-03-16 NOTE — ED Notes (Signed)
Pt reports decrease in symptoms, respirations even and unlabored. Pt instructed to go to ER if symptoms get worse, verbal understanding of all instructions provided.

## 2020-03-16 NOTE — ED Provider Notes (Signed)
[facility]    [name]: [id number] Arrival date & time: 03/16/20  1838      History   Chief Complaint Chief Complaint  Patient presents with  . Allergic Reaction    HPI Edwin Hernandez is a 44 y.o. male with history of hypertension presenting with his son for evaluation of facial swelling.  Patient states that he has an allergic reaction to peanuts: Ate some about 2 hours ago.  States he felt like his lips were swelling.  States he is able to take a Benadryl, though vomited shortly after.  States he has had improvement in swelling.  Patient denies history of anaphylaxis, chest pain, throat tightening, shortness of breath.  Has never needed EpiPen for this.     Past Medical History:  Diagnosis Date  . Hypertension     Patient Active Problem List   Diagnosis Date Noted  . Essential hypertension 06/26/2014  . Family history of diabetes mellitus (DM) 06/26/2014  . Dyspepsia 06/26/2014    History reviewed. No pertinent surgical history.     Home Medications    Prior to Admission medications   Medication Sig Start Date End Date Taking? Authorizing Provider  amLODipine (NORVASC) 5 MG tablet amlodipine 5 mg tablet  TAKE ONE TABLET BY MOUTH EVERY DAY    [provider]  ciprofloxacin (CIPRO) 500 MG tablet ciprofloxacin 500 mg tablet  TAKE ONE TABLET BY MOUTH EVERY TWELVE HOURS WITH A MEAL FOR 28 DAYS    [provider]  cyclobenzaprine (FLEXERIL) 5 MG tablet cyclobenzaprine 5 mg tablet  TAKE ONE TABLET BY MOUTH AT BEDTIME FOR 14 DAYS    [provider]  diclofenac (VOLTAREN) 50 MG EC tablet diclofenac sodium 50 mg tablet,delayed release  TAKE ONE TABLET BY MOUTH WITH FOOD TWICE DAILY FOR 14 DAYS    [provider]  phenazopyridine (PYRIDIUM) 200 MG tablet phenazopyridine 200 mg tablet  TAKE ONE TABLET BY MOUTH TWICE DAILY FOR 2 DAYS    [provider]  predniSONE (STERAPRED UNI-PAK 21 TAB) 10 MG (21) TBPK tablet Take by  mouth daily. Take steroid taper as written 03/16/20   Hall-Potvin, [place], PA-C  sertraline (ZOLOFT) 50 MG tablet sertraline 50 mg tablet  TAKE ONE TABLET BY MOUTH EVERY DAY    [provider]  tamsulosin (FLOMAX) 0.4 MG CAPS capsule tamsulosin 0.4 mg capsule  TAKE ONE CAPSULE BY MOUTH EVERY DAY    [provider]    Family History Family History  Problem Relation Age of Onset  . Diabetes Mother   . Stroke Maternal Uncle     Social History Social History   Tobacco Use  . Smoking status: Never Smoker  Substance Use Topics  . Alcohol use: No  . Drug use: Not on file     Allergies   Peanut-containing drug products   Review of Systems As per HPI   Physical Exam Triage Vital Signs ED Triage Vitals  Enc Vitals Group     BP      Pulse      Resp      Temp      Temp src      SpO2      Weight      Height      Head Circumference      Peak Flow      Pain Score      Pain Loc      Pain Edu?      Excl.  in GC?    No data found.  Updated Vital Signs BP 131/78   Pulse 85   Temp 98 F (36.7 C) (Oral)   Resp 18   SpO2 98%   Visual Acuity Right Eye Distance:   Left Eye Distance:   Bilateral Distance:    Right Eye Near:   Left Eye Near:    Bilateral Near:     Physical Exam Constitutional:      General: He is not in acute distress. HENT:     Head: Normocephalic and atraumatic.     Mouth/Throat:     Pharynx: Oropharynx is clear. No oropharyngeal exudate or posterior oropharyngeal erythema.     Comments: Negative angioedema.  Uvula midline, nonedematous.  No tonsillar hypertrophy.  Mild redness around mouth. Eyes:     General: No scleral icterus.    Pupils: Pupils are equal, round, and reactive to light.  Neck:     Comments: Trachea midline, negative JVD Cardiovascular:     Rate and Rhythm: Normal rate and regular rhythm.  Pulmonary:     Effort: Pulmonary effort is normal. No respiratory distress.     Breath sounds: No stridor. No  wheezing or rales.     Comments: Good air entry bilaterally without prolonged expiratory phase Skin:    Coloration: Skin is not jaundiced or pale.     Findings: Rash present.     Comments: Diffuse, erythematous.  No urticaria  Neurological:     Mental Status: He is alert and oriented to person, place, and time.      UC Treatments / Results  Labs (all labs ordered are listed, but only abnormal results are displayed) Labs Reviewed - No data to display  EKG   Radiology No results found.  Procedures Procedures (including critical care time)  Medications Ordered in UC Medications  dexamethasone (DECADRON) injection 10 mg (10 mg Intramuscular Given 03/16/20 1851)  diphenhydrAMINE (BENADRYL) injection 50 mg (50 mg Intramuscular Given 03/16/20 1900)  famotidine (PEPCID) tablet 40 mg (40 mg Oral Given 03/16/20 1916)    Initial Impression / Assessment and Plan / UC Course  I have reviewed the triage vital signs and the nursing notes.  Pertinent labs & imaging results that were available during my care of the patient were reviewed by me and considered in my medical decision making (see chart for details).     Patient afebrile, nontoxic, and hemodynamically stable in office.  Denying chest pain, shortness of breath.  Given IM Decadron and Benadryl and PO pepcid given in office which he tolerated well.  Patient observed for 30 minutes: Reporting improvement in erythema, warmth of rash, ease of breath.  Heart rate trending towards normal, oxygen 98% at time of discharge.  Will continue Benadryl at bedtime, steroid taper tomorrow morning.  ER return precautions discussed, pt verbalized understanding and is agreeable to plan.  Discharged in stable condition with wife who will transport him home.   Called and spoke with patient on phone this morning (7/17 08:50): States he was able to pick up prednisone taper, reviewed regimen.  States that he has been feeling well: No emesis, swelling of  lips, tongue, throat, chest pain, difficulty breathing.  Rash nearly resolved.  No additional questions at this time. Final Clinical Impressions(s) / UC Diagnoses   Final diagnoses:  Allergic reaction to peanut  Facial swelling     Discharge Instructions     You were given steroid shot today. Take steroids starting tomorrow as discussed. May take  Benadryl every night for the next few nights. Go to ER for worsening swelling, pain, throat tightening, chest pain, difficulty breathing, lightheadedness.    ED Prescriptions    Medication Sig Dispense Auth. Provider   predniSONE (STERAPRED UNI-PAK 21 TAB) 10 MG (21) TBPK tablet Take by mouth daily. Take steroid taper as written 21 tablet Hall-Potvin, [place], PA-C     PDMP not reviewed this encounter.   Hall-Potvin, Edwin Hernandez, [place] 03/17/20 [phone]

## 2020-03-16 NOTE — ED Triage Notes (Signed)
Pt with known allergy to peanuts ate cookie with peanuts on accident. Benadryl taken PTA, pt states SOB has significantly decreased since then. Rash to entire body.

## 2020-03-16 NOTE — Discharge Instructions (Signed)
You were given steroid shot today. Take steroids starting tomorrow as discussed. May take Benadryl every night for the next few nights. Go to ER for worsening swelling, pain, throat tightening, chest pain, difficulty breathing, lightheadedness.

## 2024-08-12 ENCOUNTER — Emergency Department (HOSPITAL_BASED_OUTPATIENT_CLINIC_OR_DEPARTMENT_OTHER)
Admission: EM | Admit: 2024-08-12 | Discharge: 2024-08-12 | Disposition: A | Discharge status: Home / Self Care | Payer: Self-pay | Attending: Emergency Medicine | Admitting: Emergency Medicine

## 2024-08-12 ENCOUNTER — Other Ambulatory Visit: Payer: Self-pay

## 2024-08-12 ENCOUNTER — Encounter (HOSPITAL_BASED_OUTPATIENT_CLINIC_OR_DEPARTMENT_OTHER): Payer: Self-pay | Admitting: Emergency Medicine

## 2024-08-12 ENCOUNTER — Emergency Department (HOSPITAL_BASED_OUTPATIENT_CLINIC_OR_DEPARTMENT_OTHER): Payer: Self-pay

## 2024-08-12 ENCOUNTER — Ambulatory Visit
Admission: EM | Admit: 2024-08-12 | Discharge: 2024-08-12 | Disposition: A | Discharge status: Home / Self Care | Payer: Self-pay | Attending: Family Medicine | Admitting: Family Medicine

## 2024-08-12 DIAGNOSIS — I10 Essential (primary) hypertension: Secondary | ICD-10-CM | POA: Insufficient documentation

## 2024-08-12 DIAGNOSIS — R5381 Other malaise: Secondary | ICD-10-CM | POA: Insufficient documentation

## 2024-08-12 DIAGNOSIS — G4700 Insomnia, unspecified: Secondary | ICD-10-CM | POA: Insufficient documentation

## 2024-08-12 DIAGNOSIS — H538 Other visual disturbances: Secondary | ICD-10-CM

## 2024-08-12 DIAGNOSIS — R531 Weakness: Secondary | ICD-10-CM

## 2024-08-12 DIAGNOSIS — Z91010 Allergy to peanuts: Secondary | ICD-10-CM | POA: Insufficient documentation

## 2024-08-12 LAB — CBC WITH DIFFERENTIAL/PLATELET
Abs Immature Granulocytes: 0.02 K/uL (ref 0.00–0.07)
Basophils Absolute: 0 K/uL (ref 0.0–0.1)
Basophils Relative: 0 %
Eosinophils Absolute: 0 K/uL (ref 0.0–0.5)
Eosinophils Relative: 0 %
HCT: 51.6 % (ref 39.0–52.0)
Hemoglobin: 17.7 g/dL — ABNORMAL HIGH (ref 13.0–17.0)
Immature Granulocytes: 0 %
Lymphocytes Relative: 27 %
Lymphs Abs: 2.3 K/uL (ref 0.7–4.0)
MCH: 29.9 pg (ref 26.0–34.0)
MCHC: 34.3 g/dL (ref 30.0–36.0)
MCV: 87.3 fL (ref 80.0–100.0)
Monocytes Absolute: 0.5 K/uL (ref 0.1–1.0)
Monocytes Relative: 6 %
Neutro Abs: 5.5 K/uL (ref 1.7–7.7)
Neutrophils Relative %: 67 %
Platelets: 205 K/uL (ref 150–400)
RBC: 5.91 MIL/uL — ABNORMAL HIGH (ref 4.22–5.81)
RDW: 13.5 % (ref 11.5–15.5)
WBC: 8.4 K/uL (ref 4.0–10.5)
nRBC: 0 % (ref 0.0–0.2)

## 2024-08-12 LAB — BASIC METABOLIC PANEL WITH GFR
Anion gap: 14 (ref 5–15)
BUN: 8 mg/dL (ref 6–20)
CO2: 25 mmol/L (ref 22–32)
Calcium: 9.4 mg/dL (ref 8.9–10.3)
Chloride: 98 mmol/L (ref 98–111)
Creatinine, Ser: 0.94 mg/dL (ref 0.61–1.24)
GFR, Estimated: >60 mL/min (ref >60)
Glucose, Bld: 92 mg/dL (ref 70–99)
Potassium: 3.9 mmol/L (ref 3.5–5.1)
Sodium: 137 mmol/L (ref 135–145)

## 2024-08-12 LAB — RESP PANEL BY RT-PCR (RSV, FLU A&B, COVID)  RVPGX2
Influenza A by PCR: NEGATIVE
Influenza B by PCR: NEGATIVE
Resp Syncytial Virus by PCR: NEGATIVE
SARS Coronavirus 2 by RT PCR: NEGATIVE

## 2024-08-12 MED ORDER — MELATONIN 3 MG PO TABS: 3.0000 mg | ORAL_TABLET | Freq: Every day | ORAL | 0 refills | Status: DC

## 2024-08-12 NOTE — ED Notes (Addendum)
 PCP appointment scheduled by this RN.   Tuesday, September 27, 2024 @ 10:20 AM. Edwin Hernandez.  Pt verbalized understanding.

## 2024-08-12 NOTE — ED Provider Notes (Signed)
 [facility]    [name]: [id number] Arrival date & time: 08/12/24  1806      History   Chief Complaint Chief Complaint  Patient presents with   Weakness    HPI Edwin Hernandez is a 48 y.o. male.   HPI pleasant 48 year old male presents with weakness, chills, shortness of breath, loss of appetite, blurred vision and itchy skin.  Patient patient reports symptoms began 1 week ago.  Patient reports tested positive for flu 3 weeks ago.  Patient is accompanied by family member who will be translating as Science Writer offered and denied by patient/family.  PMH significant for obesity, acute prostatitis HTN, and dyspepsia.  Past Medical History:  Diagnosis Date   Hypertension     Patient Active Problem List   Diagnosis Date Noted   Essential hypertension 06/26/2014   Family history of diabetes mellitus (DM) 06/26/2014   Dyspepsia 06/26/2014    History reviewed. No pertinent surgical history.     Home Medications    Prior to Admission medications  Not on File    Family History Family History  Problem Relation Age of Onset   Diabetes Mother    Stroke Maternal Uncle     Social History Social History[1]   Allergies   Peanut-containing drug products   Review of Systems Review of Systems  Eyes:  Positive for visual disturbance.  Respiratory:  Positive for shortness of breath.   Skin:  Positive for rash.  Neurological:  Positive for dizziness and weakness.  All other systems reviewed and are negative.    Physical Exam Triage Vital Signs ED Triage Vitals  Encounter Vitals Group     BP 08/12/24 1814 (!) 159/93     Girls Systolic BP Percentile --      Girls Diastolic BP Percentile --      Boys Systolic BP Percentile --      Boys Diastolic BP Percentile --      Pulse Rate 08/12/24 1814 70     Resp 08/12/24 1814 18     Temp 08/12/24 1814 98.5 F (36.9 C)     Temp Source 08/12/24 1814 Oral     SpO2 08/12/24 1814 95 %     Weight --      Height  08/12/24 1814 5' 5.5 (1.664 m)     Head Circumference --      Peak Flow --      Pain Score 08/12/24 1827 0     Pain Loc --      Pain Education --      Exclude from Growth Chart --    No data found.  Updated Vital Signs BP (!) 142/94 (BP Location: Left Arm) Comment: provider took manual BP.  Pulse 70   Temp 98.5 F (36.9 C) (Oral)   Resp 18   Ht 5' 6 (1.676 m)   SpO2 95%   Physical Exam Vitals and nursing note reviewed.  Constitutional:      General: He is not in acute distress.    Appearance: Normal appearance. He is obese. He is not ill-appearing.  HENT:     Head: Normocephalic and atraumatic.     Right Ear: Tympanic membrane, ear canal and external ear normal.     Left Ear: Tympanic membrane, ear canal and external ear normal.     Mouth/Throat:     Mouth: Mucous membranes are moist.     Pharynx: Oropharynx is clear.  Eyes:     Extraocular Movements: Extraocular  movements intact.     Conjunctiva/sclera: Conjunctivae normal.     Pupils: Pupils are equal, round, and reactive to light.     Comments: Patient reporting bilateral blurry vision on exam  Neck:     Comments: No JVD, no bruit Cardiovascular:     Rate and Rhythm: Normal rate and regular rhythm.     Pulses: Normal pulses.     Heart sounds: Normal heart sounds.  Pulmonary:     Effort: Pulmonary effort is normal.     Breath sounds: Normal breath sounds. No wheezing, rhonchi or rales.  Musculoskeletal:        General: Normal range of motion.     Cervical back: Normal range of motion and neck supple.  Skin:    General: Skin is warm and dry.  Neurological:     General: No focal deficit present.     Mental Status: He is alert and oriented to person, place, and time. Mental status is at baseline.  Psychiatric:        Mood and Affect: Mood normal.        Behavior: Behavior normal.      UC Treatments / Results  Labs (all labs ordered are listed, but only abnormal results are displayed) Labs Reviewed - No  data to display  EKG   Radiology No results found.  Procedures Procedures (including critical care time)  Medications Ordered in UC Medications - No data to display  Initial Impression / Assessment and Plan / UC Course  I have reviewed the triage vital signs and the nursing notes.  Pertinent labs & imaging results that were available during my care of the patient were reviewed by me and considered in my medical decision making (see chart for details).     MDM: 1.  Weakness, generalized-Advised patient and son go to [facility] now for further evaluation of blurry vision and weakness.  Patient/son agreed and verbalized understanding of these instructions and this plan of care this evening. 2.  Blurred vision-same as 1.  Patient/son agreed and verbalized understanding of these instructions and this plan of care this evening.  Staff has scheduled patient with [facility] primary care. Final Clinical Impressions(s) / UC Diagnoses   Final diagnoses:  Weakness generalized  Blurred vision     Discharge Instructions      Advised patient and son go to [facility] now for further evaluation of blurry vision and weakness.     ED Prescriptions   None    PDMP not reviewed this encounter.    [1]  Social History Tobacco Use   Smoking status: Never   Smokeless tobacco: Never  Vaping Use   Vaping status: Never Used  Substance Use Topics   Alcohol use: No   Drug use: Never     Teddy Sharper, FNP 08/12/24 1906

## 2024-08-12 NOTE — ED Provider Notes (Signed)
 [facility] Provider Note  [name]: [id number] Arrival date & time: 08/12/24 1930  Chief Complaint(s) Weakness  HPI Edwin Hernandez is a 48 y.o. male with past medical history as below, significant for hypertension who presents to the ED with complaint of generalized weakness, itching, insomnia  Patient here with failures, he was offered interpreter services but versus have family translate for him.  Reports he was diagnosed with the flu around 3 weeks ago, he stopped taking his testosterone around that time.  His flu like symptoms have since improved.  Around a week ago he began having increased insomnia, felt his skin was itchy, reports increased fatigue.  He has no chest pain, abdominal pain, no nausea or vomiting, no change in bowel or bladder function.  Past Medical History Past Medical History:  Diagnosis Date   Hypertension    Patient Active Problem List   Diagnosis Date Noted   Essential hypertension 06/26/2014   Family history of diabetes mellitus (DM) 06/26/2014   Dyspepsia 06/26/2014   Home Medication(s) Prior to Admission medications  Medication Sig Start Date End Date Taking? Authorizing Provider  melatonin 3 MG TABS tablet Take 1 tablet (3 mg total) by mouth at bedtime. 08/13/24 09/12/24  Cottie Donnice PARAS, MD                                                                                                                                    Past Surgical History History reviewed. No pertinent surgical history. Family History Family History  Problem Relation Age of Onset   Diabetes Mother    Stroke Maternal Uncle     Social History Social History[1] Allergies Peanut-containing drug products  Review of Systems A thorough review of systems was obtained and all systems are negative except as noted in the HPI and PMH.   Physical Exam Vital Signs  I have reviewed the triage vital signs BP (!) 141/95   Pulse 71   Temp  99.2 F (37.3 C) (Oral)   Resp 17   SpO2 98%  Physical Exam Vitals and nursing note reviewed.  Constitutional:      General: He is not in acute distress.    Appearance: He is well-developed.  HENT:     Head: Normocephalic and atraumatic.     Right Ear: External ear normal.     Left Ear: External ear normal.     Mouth/Throat:     Mouth: Mucous membranes are moist.  Eyes:     General: No scleral icterus.    Extraocular Movements: Extraocular movements intact.     Pupils: Pupils are equal, round, and reactive to light.  Cardiovascular:     Rate and Rhythm: Normal rate and regular rhythm.     Pulses: Normal pulses.     Heart sounds: Normal heart sounds.  Pulmonary:     Effort: Pulmonary effort is normal. No respiratory  distress.     Breath sounds: Normal breath sounds.  Abdominal:     General: Abdomen is flat.     Palpations: Abdomen is soft.     Tenderness: There is no abdominal tenderness.  Musculoskeletal:     Cervical back: No rigidity.     Right lower leg: No edema.     Left lower leg: No edema.  Skin:    General: Skin is warm and dry.     Capillary Refill: Capillary refill takes less than 2 seconds.  Neurological:     Mental Status: He is alert and oriented to person, place, and time.     GCS: GCS eye subscore is 4. GCS verbal subscore is 5. GCS motor subscore is 6.     Cranial Nerves: Cranial nerves 2-12 are intact.     Sensory: Sensation is intact.     Motor: Motor function is intact.     Coordination: Coordination is intact.     Gait: Gait is intact.     Comments: Strength 5/5 to BLUE/BLLE, equal and symmetric    Psychiatric:        Mood and Affect: Mood normal.        Behavior: Behavior normal.     ED Results and Treatments Labs (all labs ordered are listed, but only abnormal results are displayed) Labs Reviewed  CBC WITH DIFFERENTIAL/PLATELET - Abnormal; Notable for the following components:      Result Value   RBC 5.91 (*)    Hemoglobin 17.7 (*)     All other components within normal limits  RESP PANEL BY RT-PCR (RSV, FLU A&B, COVID)  RVPGX2  BASIC METABOLIC PANEL WITH GFR  CBC WITH DIFFERENTIAL/PLATELET                                                                                                                          Radiology DG Chest 2 View Result Date: 08/12/2024 EXAM: 2 VIEW(S) XRAY OF THE CHEST 08/12/2024 08:33:00 PM COMPARISON: 10/10/2013 clinical history is flu-like symptoms for several weeks. CLINICAL HISTORY: ?post viral pna FINDINGS: LUNGS AND PLEURA: No focal pulmonary opacity. No pleural effusion. No pneumothorax. HEART AND MEDIASTINUM: No acute abnormality of the cardiac and mediastinal silhouettes. BONES AND SOFT TISSUES: No acute osseous abnormality. IMPRESSION: 1. No acute cardiopulmonary process. Electronically signed by: Oneil Devonshire MD 08/12/2024 08:44 PM EST RP Workstation: [id number]    Pertinent labs & imaging results that were available during my care of the patient were reviewed by me and considered in my medical decision making (see MDM for details).  Medications Ordered in ED Medications - No data to display  Procedures Procedures  (including critical care time)  Medical Decision Making / ED Course    Medical Decision Making:    Edwin Hernandez is a 48 y.o. male  with past medical history as below, significant for hypertension who presents to the ED with complaint of generalized weakness, itching, insomnia. The complaint involves an extensive differential diagnosis and also carries with it a high risk of complications and morbidity.  Serious etiology was considered. Ddx includes but is not limited to: Electrolyte derangement, infection, pneumonia, postviral infection, dehydration,medication effect, etc  Complete initial physical exam performed, notably the patient was in nad.     Reviewed and confirmed nursing documentation for past medical history, family history, social history.  Vital signs reviewed.    Malaise Generalized weakness Insomnia > - Exam is reassuring, neuro intact.  No chest pain abdominal pain nausea or vomiting, no change with p.o. intake.  No difficulty breathing, no rashes, no fevers.  Denies any vision changes, no headaches, no diplopia, no weakness or numbness to extremities, no focal deficits, doubt cva - Patient reports he recently had the flu, he stopped taking his testosterone medication and then restarted take the testosterone medication. - Reports difficulty sleeping over the past week, no report of loud snoring or difficulty breathing at nighttime, no history of sleep apnea; may benefit from sleep study, defer to primary care - Unclear etiology of symptoms today, possibly postviral.  Possibly secondary to testosterone medication. Mild dehydration. >> tolerating po w/o difficulty - Encourage patient follow-up with PCP regarding his testosterone medication.  Sleep hygiene, rehydration, follow-up PCP, strict return precautions       6:21 PM:  I have discussed the diagnosis/risks/treatment options with the patient and family.  Evaluation and diagnostic testing in the emergency department does not suggest an emergent condition requiring admission or immediate intervention beyond what has been performed at this time.  They will follow up with PCP. We also discussed returning to the ED immediately if new or worsening sx occur. We discussed the sx which are most concerning (e.g., sudden worsening pain, fever, inability to tolerate by mouth) that necessitate immediate return.    The patient appears reasonably screened and/or stabilized for discharge and I doubt any other medical condition or other [facility] requiring further screening, evaluation, or treatment in the ED at this time prior to discharge.                 Additional history  obtained: -Additional history obtained from family and friend -External records from outside source obtained and reviewed including: Chart review including previous notes, labs, imaging, consultation notes including  Recent urgent care documentation   Lab Tests: -I ordered, reviewed, and interpreted labs.   The pertinent results include:   Labs Reviewed  CBC WITH DIFFERENTIAL/PLATELET - Abnormal; Notable for the following components:      Result Value   RBC 5.91 (*)    Hemoglobin 17.7 (*)    All other components within normal limits  RESP PANEL BY RT-PCR (RSV, FLU A&B, COVID)  RVPGX2  BASIC METABOLIC PANEL WITH GFR  CBC WITH DIFFERENTIAL/PLATELET    Notable for mild hemoconcentration  EKG   EKG Interpretation Date/Time:    Ventricular Rate:    PR Interval:    QRS Duration:    QT Interval:    QTC Calculation:   R Axis:      Text Interpretation:           Imaging Studies ordered: I ordered imaging  studies including chest x-ray I independently visualized the following imaging with scope of interpretation limited to determining acute life threatening conditions related to emergency care; findings noted above I agree with the radiologist interpretation If any imaging was obtained with contrast I closely monitored patient for any possible adverse reaction a/w contrast administration in the emergency department   Medicines ordered and prescription drug management: Meds ordered this encounter  Medications   DISCONTD: melatonin 3 MG TABS tablet    Sig: Take 1 tablet (3 mg total) by mouth at bedtime for 7 days.    Dispense:  7 tablet    Refill:  0    -I have reviewed the patients home medicines and have made adjustments as needed   Consultations Obtained: Not applicable  Cardiac Monitoring: continuous pulse oximetry interpreted by myself, 100% on room air.    Social Determinants of Health:  Diagnosis or treatment significantly limited by social determinants of  health: obesity   Reevaluation: After the interventions noted above, I reevaluated the patient and found that they have stayed the same  Co morbidities that complicate the patient evaluation  Past Medical History:  Diagnosis Date   Hypertension       Dispostion: Disposition decision including need for hospitalization was considered, and patient discharged from emergency department.    Final Clinical Impression(s) / ED Diagnoses Final diagnoses:  Insomnia, unspecified type  Malaise         [1]  Social History Tobacco Use   Smoking status: Never   Smokeless tobacco: Never  Vaping Use   Vaping status: Never Used  Substance Use Topics   Alcohol use: No   Drug use: Never     Elnor Jayson LABOR, DO 08/13/24 1821

## 2024-08-12 NOTE — ED Triage Notes (Signed)
 Pt presents from UC for eval.  Flu 3 weeks ago.  Got better and then last week began feeling bad again, chills, fatigue.  No n/v/d or shob.  No fevers.

## 2024-08-12 NOTE — Discharge Instructions (Addendum)
 Advised patient and son go to [facility] now for further evaluation of blurry vision and weakness.

## 2024-08-12 NOTE — ED Notes (Signed)
 Patient is being discharged from the Urgent Care and sent to the Emergency Department via private vehicle with son. Per Ozell Major NP, patient is in need of higher level of care due to generalized weakness + blurred vision. Patient is aware and verbalizes understanding of plan of care.   Vitals:   08/12/24 1814 08/12/24 1846  BP: (!) 159/93 (!) 142/94  Pulse: 70   Resp: 18   Temp: 98.5 F (36.9 C)   SpO2: 95%

## 2024-08-12 NOTE — Discharge Instructions (Addendum)
 It was a pleasure caring for you today in the emergency department.  Be sure to take your medications as prescribed.  Please follow-up with your primary care doctor regarding testosterone therapy.  I have sent a prescription for melatonin which can help with your insomnia.  Please return to the emergency department for any worsening or worrisome symptoms.    Fue un placer atenderle [employer] en el servicio de [place].  Asegrese de tomar sus medicamentos segn lo recetado. Por favor, consulte con su mdico de cabecera sobre la terapia con testosterona. Le he enviado una receta de [place], que puede ayudarle con su insomnio.  Si presenta un empeoramiento de los sntomas o cualquier sntoma preocupante, regrese al servicio de urgencias.

## 2024-08-12 NOTE — ED Triage Notes (Signed)
 IPAD translator offered. Pt denied. Family member at bedside will translate. Pt presents with complaints of weakness, chills, SOB, loss of appetite, blurred vision, and itchy skin. Symptoms began one week ago. No pain at this time. BP in triage is 159/93. States he is unsure of his baseline. Tested positive for the flu three weeks ago.

## 2024-08-13 ENCOUNTER — Telehealth (HOSPITAL_BASED_OUTPATIENT_CLINIC_OR_DEPARTMENT_OTHER): Payer: Self-pay | Admitting: Emergency Medicine

## 2024-08-13 MED ORDER — MELATONIN 3 MG PO TABS: 3.0000 mg | ORAL_TABLET | Freq: Every day | ORAL | 0 refills | Status: AC

## 2024-08-13 NOTE — Telephone Encounter (Signed)
 Patient says pharmacy did not get melatonin script; new script sent.

## 2024-09-27 ENCOUNTER — Ambulatory Visit: Payer: Self-pay | Admitting: Nurse Practitioner
